# Patient Record
Sex: Female | Born: 1995 | Race: Black or African American | Hispanic: No | Marital: Single | Smoking: Never smoker
Health system: Southern US, Community
[De-identification: ages and names within clinical notes are randomized; demographics above are authoritative.]

## PROBLEM LIST (undated history)

## (undated) DIAGNOSIS — J45909 Unspecified asthma, uncomplicated: Secondary | ICD-10-CM

## (undated) HISTORY — PX: WISDOM TOOTH EXTRACTION: SHX21

---

## 2019-01-26 ENCOUNTER — Emergency Department (HOSPITAL_COMMUNITY)
Admission: EM | Admit: 2019-01-26 | Discharge: 2019-01-26 | Disposition: A | Discharge status: Home / Self Care | Payer: Self-pay | Attending: Emergency Medicine | Admitting: Emergency Medicine

## 2019-01-26 ENCOUNTER — Encounter (HOSPITAL_COMMUNITY): Payer: Self-pay | Admitting: Emergency Medicine

## 2019-01-26 ENCOUNTER — Other Ambulatory Visit: Payer: Self-pay

## 2019-01-26 DIAGNOSIS — J45909 Unspecified asthma, uncomplicated: Secondary | ICD-10-CM | POA: Insufficient documentation

## 2019-01-26 DIAGNOSIS — R4589 Other symptoms and signs involving emotional state: Secondary | ICD-10-CM | POA: Insufficient documentation

## 2019-01-26 DIAGNOSIS — R112 Nausea with vomiting, unspecified: Secondary | ICD-10-CM | POA: Insufficient documentation

## 2019-01-26 DIAGNOSIS — F1210 Cannabis abuse, uncomplicated: Secondary | ICD-10-CM | POA: Insufficient documentation

## 2019-01-26 HISTORY — DX: Unspecified asthma, uncomplicated: J45.909

## 2019-01-26 LAB — POC URINE PREG, ED: Preg Test, Ur: NEGATIVE

## 2019-01-26 MED ORDER — LIDOCAINE-EPINEPHRINE (PF) 2 %-1:200000 IJ SOLN: 10.0000 mL | Freq: Once | INTRAMUSCULAR | Status: DC

## 2019-01-26 MED ORDER — ONDANSETRON 4 MG PO TBDP
4.0000 mg | ORAL_TABLET | Freq: Once | ORAL | Status: AC
  Administered 2019-01-26: 4 mg via ORAL
  Filled 2019-01-26: qty 1

## 2019-01-26 MED ORDER — TETANUS-DIPHTH-ACELL PERTUSSIS 5-2.5-18.5 LF-MCG/0.5 IM SUSP: 0.5000 mL | Freq: Once | INTRAMUSCULAR | Status: DC

## 2019-01-26 MED ORDER — ONDANSETRON 4 MG PO TBDP: 4.0000 mg | ORAL_TABLET | Freq: Three times a day (TID) | ORAL | 0 refills | Status: DC | PRN

## 2019-01-26 NOTE — Discharge Instructions (Addendum)
You were seen in the emergency department today for nausea, vomiting, and a headache.  Your pregnancy test was negative.  We are sending you home with Zofran to take every 8 hours as needed for nausea and vomiting.  We have prescribed you new medication(s) today. Discuss the medications prescribed today with your pharmacist as they can have adverse effects and interactions with your other medicines including over the counter and prescribed medications. Seek medical evaluation if you start to experience new or abnormal symptoms after taking one of these medicines, seek care immediately if you start to experience difficulty breathing, feeling of your throat closing, facial swelling, or rash as these could be indications of a more serious allergic reaction  Follow up with primary care within 3 to 5 days for reevaluation.  Return to the ER for new or worsening symptoms or any other concerns.

## 2019-01-26 NOTE — ED Provider Notes (Signed)
[facility] EMERGENCY DEPARTMENT Provider Note   CSN: [id number] Arrival date & time: 01/26/19  1151   History   Chief Complaint Chief Complaint  Patient presents with  . Emesis    HPI Tina Ward is a 23 y.o. female with a hx of asthma who presents to the ED w/ complaints of nausea/vomiting. Patient states her father has been sick in the hospital which has increased her stress, she has been upset.  She states that she began feeling nauseous last evening with decreased appetite. She notes this AM she was going to see him and the nausea increased w/ 1 episode of emesis. Her father passed away today. Since then she has had continued nausea with an additional 3 episodes of vomiting. She has been crying and this has given her a migraine which is gradual onset similar to prior.  She states she feels generally upset, other than this no alleviating/aggravating factors. Denies fever, chills, hematemesis, chest pain, dyspnea, abdominal pain, diarrhea, or dysuria.      HPI  Past Medical History:  Diagnosis Date  . Asthma     There are no active problems to display for this patient.   History reviewed. No pertinent surgical history.   OB History   No obstetric history on file.      Home Medications    Prior to Admission medications   Not on File    Family History History reviewed. No pertinent family history.  Social History Social History   Tobacco Use  . Smoking status: Never Smoker  . Smokeless tobacco: Never Used  Substance Use Topics  . Alcohol use: Yes  . Drug use: Yes    Types: Marijuana     Allergies   Patient has no allergy information on record.   Review of Systems Review of Systems  Constitutional: Positive for appetite change. Negative for chills, fever and unexpected weight change.  Respiratory: Negative for shortness of breath.   Cardiovascular: Negative for chest pain.  Gastrointestinal: Positive for nausea and vomiting.  Negative for abdominal pain, blood in stool, constipation and diarrhea.  Genitourinary: Negative for dysuria, vaginal bleeding and vaginal discharge.  Neurological: Negative for syncope.  Psychiatric/Behavioral: Negative for suicidal ideas. The patient is nervous/anxious (upset).   All other systems reviewed and are negative.    Physical Exam Updated Vital Signs BP (!) 144/95 (BP Location: Right Arm)   Pulse 92   Temp 98 F (36.7 C) (Oral)   Resp 14   Ht 5\' 4"  (1.626 m)   Wt 72.6 kg   LMP 01/16/2019 (Within Days)   SpO2 100%   BMI 27.46 kg/m   Physical Exam Vitals signs and nursing note reviewed.  Constitutional:      General: She is not in acute distress.    Appearance: She is well-developed. She is not toxic-appearing.  HENT:     Head: Normocephalic and atraumatic.  Eyes:     General:        Right eye: No discharge.        Left eye: No discharge.     Conjunctiva/sclera: Conjunctivae normal.  Neck:     Musculoskeletal: Neck supple. No neck rigidity.  Cardiovascular:     Rate and Rhythm: Normal rate and regular rhythm.  Pulmonary:     Effort: Pulmonary effort is normal. No respiratory distress.     Breath sounds: Normal breath sounds. No wheezing, rhonchi or rales.  Abdominal:     General: There is no distension.  Palpations: Abdomen is soft.     Tenderness: There is no abdominal tenderness. There is no guarding or rebound.  Skin:    General: Skin is warm and dry.     Findings: No rash.  Neurological:     General: No focal deficit present.     Mental Status: She is alert.     Comments: Clear speech.  CN III through XII grossly intact.  Sensation grossly intact bilateral upper and lower extremities.  Symmetric strength throughout.  Ambulatory with steady gait.  No coordination dysfunction.  Psychiatric:        Behavior: Behavior normal.    ED Treatments / Results  Labs (all labs ordered are listed, but only abnormal results are displayed) Labs Reviewed -  No data to display  EKG None  Radiology No results found.  Procedures Procedures (including critical care time)  Medications Ordered in ED Medications - No data to display   Initial Impression / Assessment and Plan / ED Course  I have reviewed the triage vital signs and the nursing notes.  Pertinent labs & imaging results that were available during my care of the patient were reviewed by me and considered in my medical decision making (see chart for details).   Patient presents to the emergency department with nausea, vomiting, decreased appetite, and headache in the setting of increased stress/sadness w/ her father being in the hospital and ultimately passing away today.  Nontoxic-appearing, no apparent distress, vitals WNL except for mildly elevated blood pressure, doubt HTN emergency, PCP recheck.  No headache red flags, do not suspect ischemic CVA, bleed, meningitis, or other life-threatening etiology.  Her abdomen is completely nontender without peritoneal signs, do not suspect acute surgical abdominal pathology leading to nausea and vomiting.  Point-of-care pregnancy test is negative. Patient did not wish to have an IV or blood work performed. Following Zofran she is been able to tolerate p.o. and is requesting to be discharged home which I feel is reasonable.  Will give short prescription for Zofran as well as outpatient counseling resources.  No SI/HI. I discussed results, treatment plan, need for follow-up, and return precautions with the patient. Provided opportunity for questions, patient confirmed understanding and is in agreement with plan.   Final Clinical Impressions(s) / ED Diagnoses   Final diagnoses:  Non-intractable vomiting with nausea, unspecified vomiting type    ED Discharge Orders         Ordered    ondansetron (ZOFRAN ODT) 4 MG disintegrating tablet  Every 8 hours PRN     01/26/19 1310           Belladonna Lubinski, WaycrossSamantha R, PA-C 01/26/19 1334    Margarita Grizzleay,  Danielle, MD 01/26/19 [phone]

## 2019-01-26 NOTE — ED Triage Notes (Signed)
Pt arrives to ED after being wheeled down from this hospital after she witnessed the passing of a family member today. Pt stated she has thrown up three times and is very nauseous.

## 2019-01-26 NOTE — ED Notes (Signed)
Patient verbalizes understanding of discharge instructions. Opportunity for questioning and answers were provided. Armband removed by staff, pt discharged from ED.  

## 2019-03-16 ENCOUNTER — Other Ambulatory Visit: Payer: Self-pay

## 2019-03-20 ENCOUNTER — Encounter: Payer: Self-pay | Admitting: Obstetrics & Gynecology

## 2019-03-20 ENCOUNTER — Ambulatory Visit: Payer: BLUE CROSS/BLUE SHIELD | Admitting: Obstetrics & Gynecology

## 2019-03-20 VITALS — BP 130/80 | Ht 63.75 in | Wt 152.0 lb

## 2019-03-20 DIAGNOSIS — L723 Sebaceous cyst: Secondary | ICD-10-CM

## 2019-03-20 DIAGNOSIS — Z01419 Encounter for gynecological examination (general) (routine) without abnormal findings: Secondary | ICD-10-CM | POA: Diagnosis not present

## 2019-03-20 DIAGNOSIS — Z30015 Encounter for initial prescription of vaginal ring hormonal contraceptive: Secondary | ICD-10-CM

## 2019-03-20 DIAGNOSIS — Z113 Encounter for screening for infections with a predominantly sexual mode of transmission: Secondary | ICD-10-CM

## 2019-03-20 MED ORDER — ETONOGESTREL-ETHINYL ESTRADIOL 0.12-0.015 MG/24HR VA RING: 1.0000 | VAGINAL_RING | VAGINAL | 4 refills | Status: DC

## 2019-03-20 NOTE — Progress Notes (Signed)
Tina Ward 02/04/1996 [id number]   History:    89 y.o. G0 Single.  Master in Business.  Boyfriend  RP:  New patient presenting for annual gyn exam   HPI: Menses regular normal every month, normal flow.  No BTB.  No pelvic pain.  Occasional sebaceous gland cyst at pubis.  Using condoms.  Urine/BMs normal.  Breasts normal.  BMI 26.3.  Not very active physically.    Past medical history,surgical history, family history and social history were all reviewed and documented in the EPIC chart.  Gynecologic History Patient's last menstrual period was 03/11/2019. Contraception: condoms Last Pap: 2019. Results were: normal per patient Last mammogram: Never Bone Density: Never Colonoscopy: Never  Obstetric History OB History  Gravida Para Term Preterm AB Living  0 0 0 0 0 0  SAB TAB Ectopic Multiple Live Births  0 0 0 0 0     ROS: A ROS was performed and pertinent positives and negatives are included in the history.  GENERAL: No fevers or chills. HEENT: No change in vision, no earache, sore throat or sinus congestion. NECK: No pain or stiffness. CARDIOVASCULAR: No chest pain or pressure. No palpitations. PULMONARY: No shortness of breath, cough or wheeze. GASTROINTESTINAL: No abdominal pain, nausea, vomiting or diarrhea, melena or bright red blood per rectum. GENITOURINARY: No urinary frequency, urgency, hesitancy or dysuria. MUSCULOSKELETAL: No joint or muscle pain, no back pain, no recent trauma. DERMATOLOGIC: No rash, no itching, no lesions. ENDOCRINE: No polyuria, polydipsia, no heat or cold intolerance. No recent change in weight. HEMATOLOGICAL: No anemia or easy bruising or bleeding. NEUROLOGIC: No headache, seizures, numbness, tingling or weakness. PSYCHIATRIC: No depression, no loss of interest in normal activity or change in sleep pattern.     Exam:   BP 130/80   Ht 5' 3.75" (1.619 m)   Wt 152 lb (68.9 kg)   LMP 03/11/2019 Comment: no birth control  BMI 26.30 kg/m    Body mass index is 26.3 kg/m.  General appearance : Well developed well nourished female. No acute distress HEENT: Eyes: no retinal hemorrhage or exudates,  Neck supple, trachea midline, no carotid bruits, no thyroidmegaly Lungs: Clear to auscultation, no rhonchi or wheezes, or rib retractions  Heart: Regular rate and rhythm, no murmurs or gallops Breast:Examined in sitting and supine position were symmetrical in appearance, no palpable masses or tenderness,  no skin retraction, no nipple inversion, no nipple discharge, no skin discoloration, no axillary or supraclavicular lymphadenopathy Abdomen: no palpable masses or tenderness, no rebound or guarding Extremities: no edema or skin discoloration or tenderness  Pelvic: Vulva: Normal             Vagina: No gross lesions or discharge  Cervix: No gross lesions or discharge.  Pap reflex/Gono-Chlam done.  Uterus  AV, normal size, shape and consistency, non-tender and mobile  Adnexa  Without masses or tenderness  Anus: Normal   Assessment/Plan:  23 y.o. female for annual exam   1. Encounter for routine gynecological examination with Papanicolaou smear of cervix Normal gynecologic exam.  Pap relfex done today.  Breast exam normal.  BMI 26.3.  Recommend aerobic activities 5 times a week and weight lifting every 2 days.  2. Encounter for initial prescription of vaginal ring hormonal contraceptive Counseling on contraception done.  Decision to start on the Nuvaring.  Usage, risks and benefits reviewed.  Prescription sent to pharmacy.  3. Screen for STD (sexually transmitted disease) Strict condom use recommended. - Gono-Chlam on  Pap - HIV antibody (with reflex) - RPR - Hepatitis C Antibody - Hepatitis B Surface AntiGEN  4. Sebaceous cyst Sebaceous gland cyst at pubis.  Counseling done on prevention and management.  Recommend warm soaking and warm compresses every day.  May apply Neosporin cream as needed.  If worsening, will call for a  Clindamycin gel prescription.  Patient reassured.  Other orders - etonogestrel-ethinyl estradiol (NUVARING) 0.12-0.015 MG/24HR vaginal ring; Place 1 each vaginally every 28 (twenty-eight) days. Insert vaginally and leave in place for 4 consecutive weeks, then switch ring.   Princess Bruins MD, 12:01 PM 03/20/2019

## 2019-03-20 NOTE — Patient Instructions (Signed)
1. Encounter for routine gynecological examination with Papanicolaou smear of cervix Normal gynecologic exam.  Pap relfex done today.  Breast exam normal.  BMI 26.3.  Recommend aerobic activities 5 times a week and weight lifting every 2 days.  2. Encounter for initial prescription of vaginal ring hormonal contraceptive Counseling on contraception done.  Decision to start on the [place].  Usage, risks and benefits reviewed.  Prescription sent to pharmacy.  3. Screen for STD (sexually transmitted disease) Strict condom use recommended. - Gono-Chlam on Pap - HIV antibody (with reflex) - RPR - Hepatitis C Antibody - Hepatitis B Surface AntiGEN  4. Sebaceous cyst Sebaceous gland cyst at pubis.  Counseling done on prevention and management.  Recommend warm soaking and warm compresses every day.  May apply Neosporin cream as needed.  If worsening, will call for a Clindamycin gel prescription.  Patient reassured.  Other orders - etonogestrel-ethinyl estradiol (NUVARING) 0.12-0.015 MG/24HR vaginal ring; Place 1 each vaginally every 28 (twenty-eight) days. Insert vaginally and leave in place for 4 consecutive weeks, then switch ring.  Tina Ward, it was a pleasure meeting you today!  I will inform you of your results as soon as they are available.    Votre francais est excellent... continuez!  :-)

## 2019-03-21 LAB — HIV ANTIBODY (ROUTINE TESTING W REFLEX): HIV 1&2 Ab, 4th Generation: NONREACTIVE

## 2019-03-21 LAB — HEPATITIS C ANTIBODY
Hepatitis C Ab: NONREACTIVE
SIGNAL TO CUT-OFF: 0.01 (ref <1.00)

## 2019-03-21 LAB — PAP THINPREP ASCUS RFLX HPV RFLX TYPE
C. trachomatis RNA, TMA: NOT DETECTED
N. gonorrhoeae RNA, TMA: NOT DETECTED

## 2019-03-21 LAB — RPR: RPR Ser Ql: NONREACTIVE

## 2019-03-21 LAB — HEPATITIS B SURFACE ANTIGEN: Hepatitis B Surface Ag: NONREACTIVE

## 2019-03-23 ENCOUNTER — Encounter: Payer: Self-pay | Admitting: *Deleted

## 2019-10-23 ENCOUNTER — Ambulatory Visit: Payer: Self-pay | Admitting: Surgery

## 2019-10-23 NOTE — H&P (Signed)
Tina Ward Documented: 10/23/2019 11:12 AM Location: [facility] Patient #: [id number] DOB: 1996/01/24 Single / Language: Cleophus Molt / Race: Black or African American Female  History of Present Illness Marcello Moores A. Zavien Clubb MD; 10/23/2019 1:13 PM) Patient words: Patient presents with chief complaint of cyst in right groin. She has had a two-month history of an inflamed cyst in her right groin. It causes mild to moderate discomfort. Location is right groin. There is no drainage, redness or warmth. It is persisted for number of months and has not subsided.  The patient is a 23 year old female.   Allergies (Chanel Teressa Senter, CMA; 10/23/2019 11:13 AM) Pollens Dust Allergies Reconciled  Medication History (Chanel Teressa Senter, CMA; 10/23/2019 11:13 AM) No Current Medications Medications Reconciled    Vitals (Chanel Nolan CMA; 10/23/2019 11:13 AM) 10/23/2019 11:13 AM Weight: 149.38 lb Height: 63in Body Surface Area: 1.71 m Body Mass Index: 26.46 kg/m  Temp.: 98.25F  Pulse: 100 (Regular)  BP: 114/64 (Sitting, Left Arm, Standard)        Physical Exam (Jadan Rouillard A. Naliya Gish MD; 10/23/2019 1:15 PM)  General Mental Status-Alert. General Appearance-Consistent with stated age. Hydration-Well hydrated. Voice-Normal.  Integumentary Note: Right groin cyst noted measuring 3 x 4 cm. This is at the right crease. Just below the inguinal ligament. There are some extension of the process into the right crease. No signs of infection. Possible area of hidradenitis without signs of infection  Head and Neck Head-normocephalic, atraumatic with no lesions or palpable masses. Trachea-midline. Thyroid Gland Characteristics - normal size and consistency.  Chest and Lung Exam Note: CTA WOB normal  Cardiovascular Note: NSR  Neurologic Neurologic evaluation reveals -alert and oriented x 3 with no impairment of recent or remote memory. Mental  Status-Normal.  Musculoskeletal Normal Exam - Left-Upper Extremity Strength Normal and Lower Extremity Strength Normal. Normal Exam - Right-Upper Extremity Strength Normal and Lower Extremity Strength Normal.    Assessment & Plan (Anne Sebring A. Darrio Bade MD; 10/23/2019 1:18 PM)  SEBACEOUS CYST (L72.3) Impression: Right groin Recommend excision due to discomfort and history of previous infections  risks benefits and alternatives to surgery discussed  agrees to proceed  total time 30 min face to face, chart review records review and documentation  Current Plans Pt Education - [employer] Free Text Education/Instructions: discussed with patient and provided information. The pathophysiology of skin & subcutaneous masses was discussed. Natural history risks without surgery were discussed. I recommended surgery to remove the mass. I explained the technique of removal with use of local anesthesia & possible need for more aggressive sedation/anesthesia for patient comfort.  Risks such as bleeding, infection, wound breakdown, heart attack, death, and other risks were discussed. I noted a good likelihood this will help address the problem. Possibility that this will not correct all symptoms was explained. Possibility of regrowth/recurrence of the mass was discussed. We will work to minimize complications. Questions were answered. The patient expresses understanding & wishes to proceed with surgery.  You are being scheduled for surgery- Our schedulers will call you.  You should hear from our office's scheduling department within 5 working days about the location, date, and time of surgery. We try to make accommodations for patient's preferences in scheduling surgery, but sometimes the OR schedule or the surgeon's schedule prevents [place] from making those accommodations.  If you have not heard from our office [phone]) in 5 working days, call the office and ask for your surgeon's nurse.  If  you have other questions about your diagnosis, plan, or surgery,  call the office and ask for your surgeon's nurse.

## 2019-11-22 ENCOUNTER — Ambulatory Visit
Admission: RE | Admit: 2019-11-22 | Discharge: 2019-11-22 | Disposition: A | Discharge status: Home / Self Care | Payer: 59 | Source: Ambulatory Visit | Attending: Family Medicine | Admitting: Family Medicine

## 2019-11-22 ENCOUNTER — Other Ambulatory Visit: Payer: Self-pay | Admitting: Family Medicine

## 2019-11-22 DIAGNOSIS — R1084 Generalized abdominal pain: Secondary | ICD-10-CM

## 2020-10-24 IMAGING — DX DG ABDOMEN ACUTE W/ 1V CHEST
3 series · 3 of 3 positions shown · non-contrast
Comparison: None.

CLINICAL DATA: Left-sided abdominal pain. History of constipation.

EXAM:
DG ABDOMEN ACUTE W/ 1V CHEST

[dg abd acute w/chest (1 of 3)]
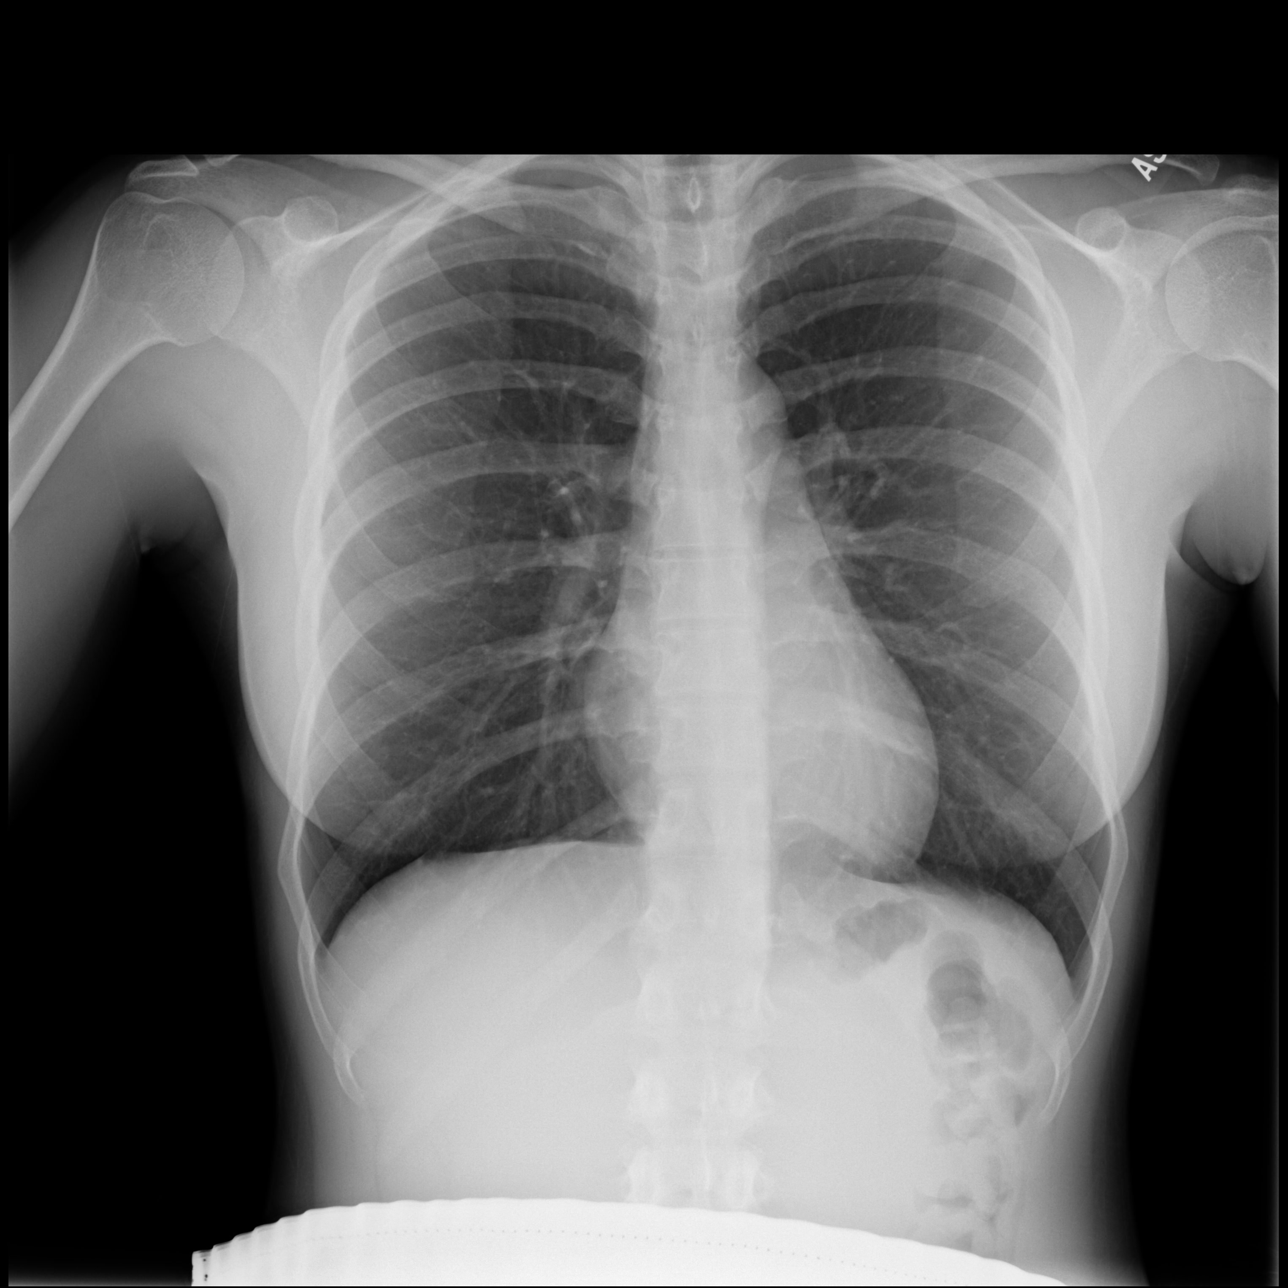

[dg abd acute w/chest (2 of 3)]
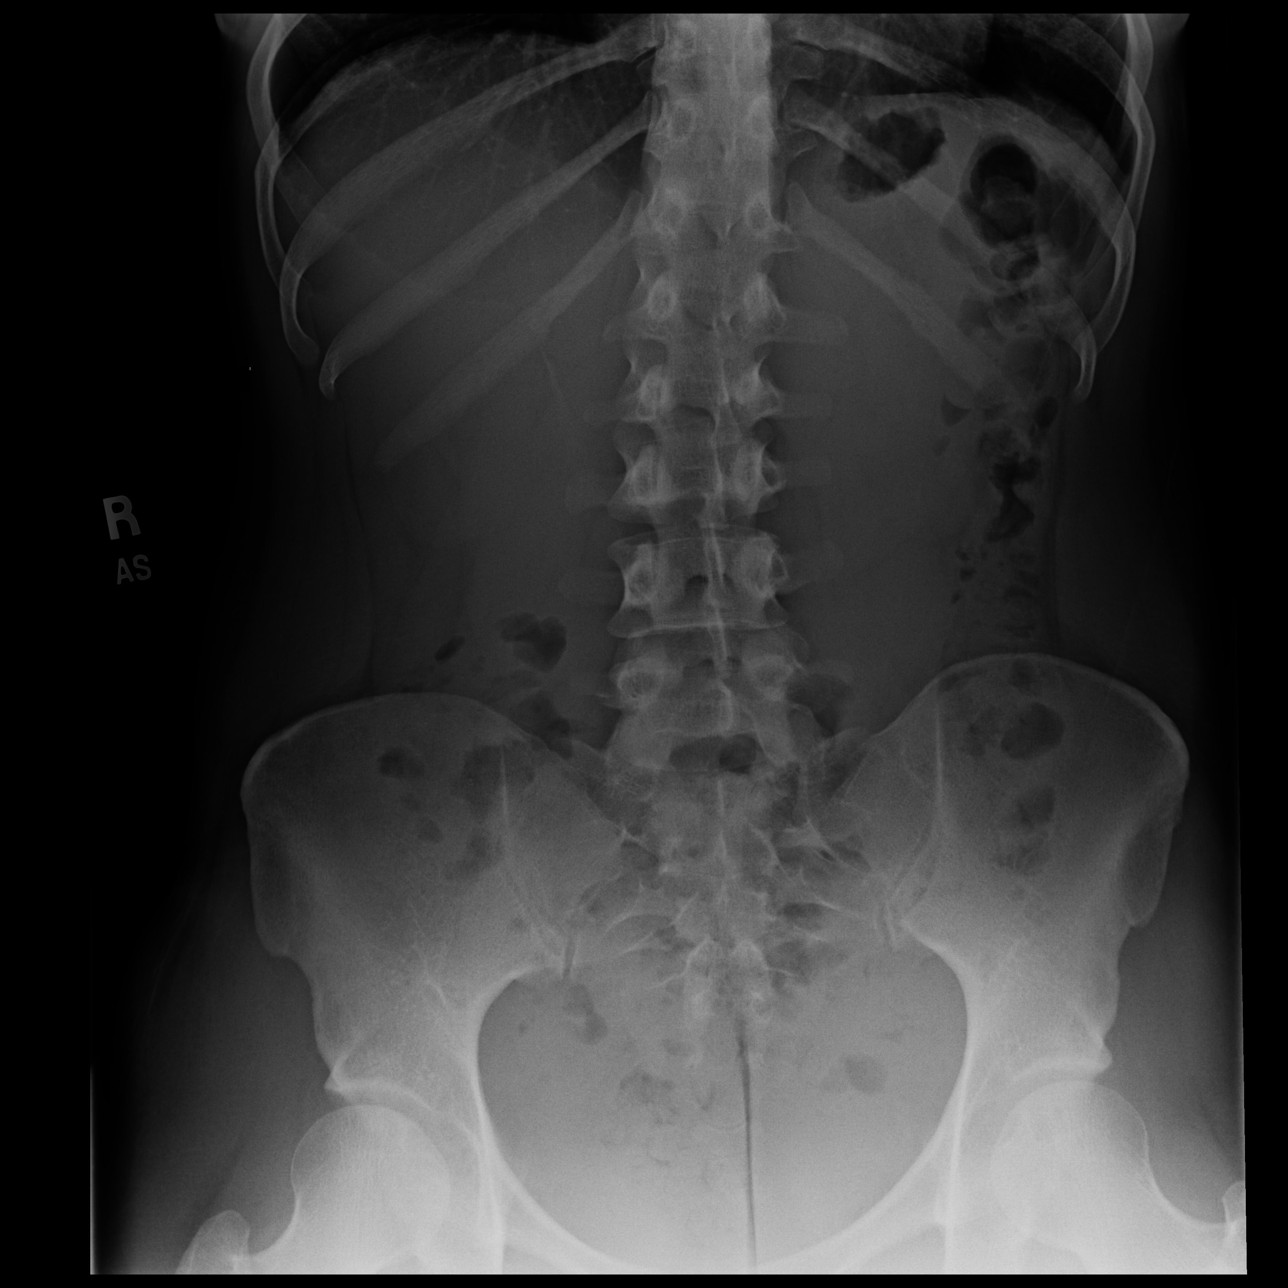

[dg abd acute w/chest (3 of 3)]
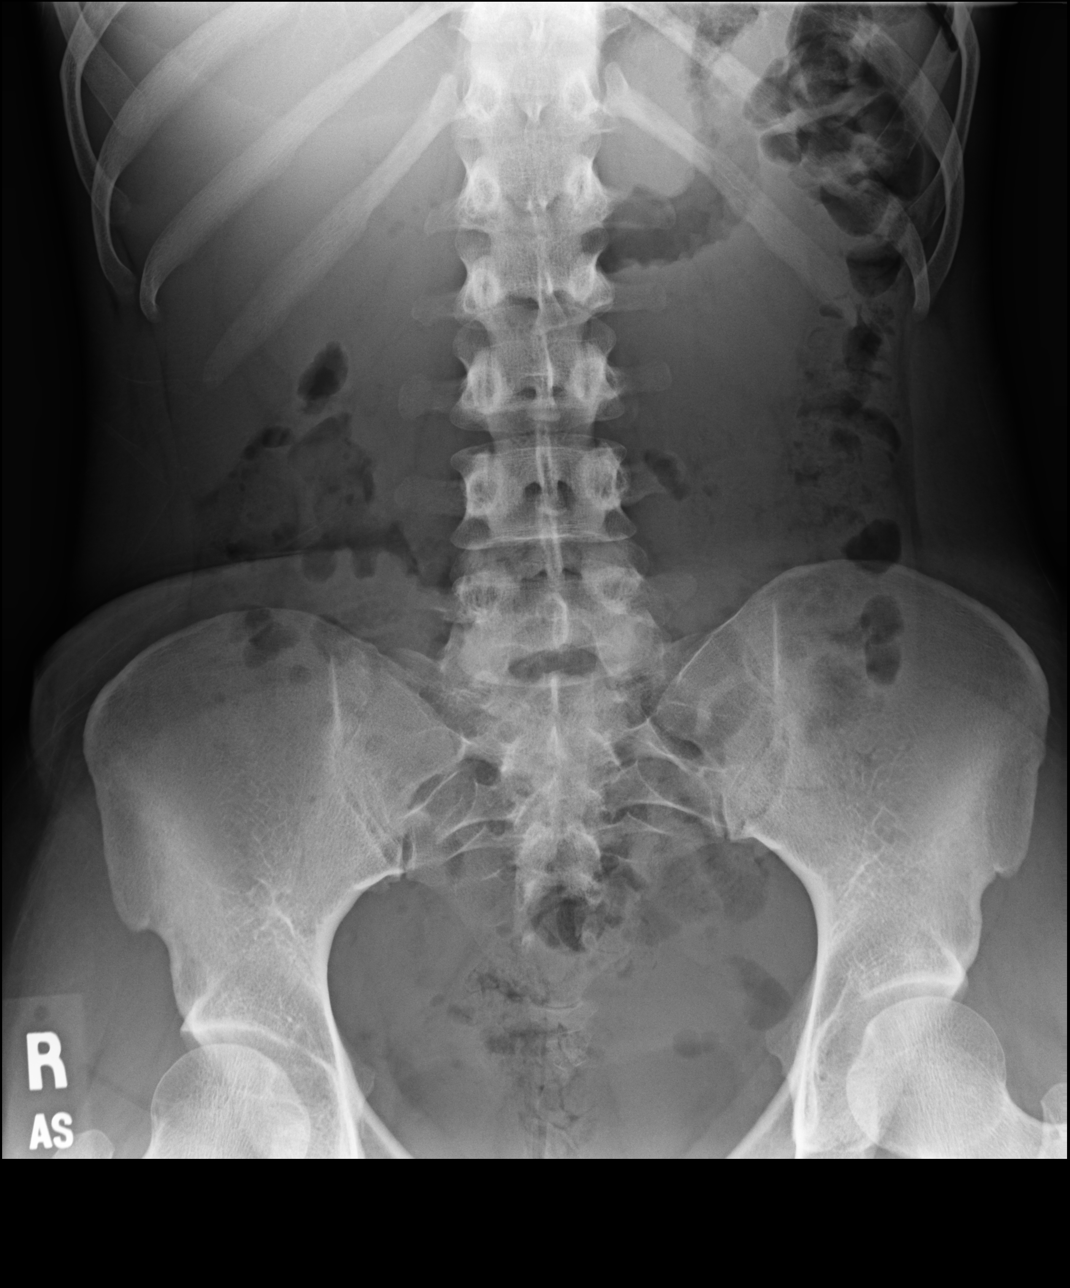

[3 of 3 positions shown; findings below may reference images not displayed]

FINDINGS: The cardiomediastinal silhouette is within normal limits. The lungs
are clear. No pleural effusion or pneumothorax is identified. No
acute osseous abnormality is seen.

No intraperitoneal free air is identified. There is a small to
moderate amount of stool in the colon and rectum. No dilated loops
of bowel are seen to suggest obstruction. No acute osseous
abnormality is identified.
IMPRESSION: Negative abdominal radiographs. No acute cardiopulmonary disease.

## 2022-09-11 ENCOUNTER — Encounter: Payer: Self-pay | Admitting: Radiology

## 2022-09-11 ENCOUNTER — Ambulatory Visit: Payer: Commercial Managed Care - HMO | Admitting: Radiology

## 2022-09-11 VITALS — BP 104/76 | Ht 64.25 in | Wt 194.0 lb

## 2022-09-11 DIAGNOSIS — R350 Frequency of micturition: Secondary | ICD-10-CM

## 2022-09-11 DIAGNOSIS — N898 Other specified noninflammatory disorders of vagina: Secondary | ICD-10-CM | POA: Diagnosis not present

## 2022-09-11 LAB — URINALYSIS, COMPLETE W/RFL CULTURE
Bacteria, UA: NONE SEEN /HPF
Bilirubin Urine: NEGATIVE
Glucose, UA: NEGATIVE
Hgb urine dipstick: NEGATIVE
Hyaline Cast: NONE SEEN /LPF
Ketones, ur: NEGATIVE
Leukocyte Esterase: NEGATIVE
Nitrites, Initial: NEGATIVE
Protein, ur: NEGATIVE
RBC / HPF: NONE SEEN /HPF (ref 0–2)
Specific Gravity, Urine: 1.015 (ref 1.001–1.035)
WBC, UA: NONE SEEN /HPF (ref 0–5)
pH: 7 (ref 5.0–8.0)

## 2022-09-11 LAB — WET PREP FOR TRICH, YEAST, CLUE

## 2022-09-11 LAB — NO CULTURE INDICATED

## 2022-09-11 MED ORDER — METRONIDAZOLE 0.75 % VA GEL: 1.0000 | Freq: Every day | VAGINAL | 0 refills | Status: AC

## 2022-09-11 NOTE — Progress Notes (Signed)
      Subjective: Tina Ward is a 26 y.o. female who complains of vaginal discharge, odor, urinary frequency, no urgency, no dysuria. Symptoms since 10/2021 (treated in [place] with antibiotics, symptoms returned). Dark colored flow on first day of period, then red then ends with brown spotting.    Review of Systems  All other systems reviewed and are negative.   Past Medical History:  Diagnosis Date   Asthma       Objective:  Today's Vitals   09/11/22 1004  BP: 104/76  Weight: 194 lb (88 kg)  Height: 5' 4.25" (1.632 m)   Body mass index is 33.04 kg/m.   -General: no acute distress -Vulva: without lesions or discharge -Vagina: discharge present, aptima swab and wet prep obtained -Cervix: no lesion or discharge, no CMT -Perineum: no lesions -Uterus: Mobile, non tender -Adnexa: no masses or tenderness  Urine dipstick shows negative for all components.  Micro exam: negative for WBC's or RBC's.  Microscopic wet-mount exam shows clue cells.   Chaperone offered and declined.  Assessment:/Plan:   1. Urinary frequency Reassured neg u/s - Urinalysis,Complete w/RFL Culture  2. Vaginal odor + BV Rx sent for metrogel - WET PREP FOR TRICH, YEAST, CLUE    Will contact patient with results of testing completed today. Avoid intercourse until symptoms are resolved. Safe sex encouraged. Avoid the use of soaps or perfumed products in the peri area. Avoid tub baths and sitting in sweaty or wet clothing for prolonged periods of time.
